# Patient Record
Sex: Male | Born: 1970 | Race: White | Hispanic: No | Marital: Married | Smoking: Never smoker
Health system: Southern US, Community
[De-identification: ages and names within clinical notes are randomized; demographics above are authoritative.]

## PROBLEM LIST (undated history)

## (undated) DIAGNOSIS — I10 Essential (primary) hypertension: Secondary | ICD-10-CM

## (undated) DIAGNOSIS — E7800 Pure hypercholesterolemia, unspecified: Secondary | ICD-10-CM

## (undated) HISTORY — PX: EYE MUSCLE SURGERY: SHX370

---

## 2005-09-13 ENCOUNTER — Ambulatory Visit: Payer: Self-pay | Admitting: Family Medicine

## 2006-03-31 ENCOUNTER — Ambulatory Visit: Payer: Self-pay | Admitting: Family Medicine

## 2006-09-20 ENCOUNTER — Ambulatory Visit: Payer: Self-pay | Admitting: Family Medicine

## 2006-11-29 ENCOUNTER — Ambulatory Visit (HOSPITAL_BASED_OUTPATIENT_CLINIC_OR_DEPARTMENT_OTHER): Admission: RE | Admit: 2006-11-29 | Discharge: 2006-11-29 | Payer: Self-pay | Admitting: Ophthalmology

## 2018-08-14 ENCOUNTER — Emergency Department: Payer: BLUE CROSS/BLUE SHIELD

## 2018-08-14 ENCOUNTER — Other Ambulatory Visit: Payer: Self-pay

## 2018-08-14 ENCOUNTER — Emergency Department
Admission: EM | Admit: 2018-08-14 | Discharge: 2018-08-14 | Disposition: A | Discharge status: Home / Self Care | Payer: BLUE CROSS/BLUE SHIELD | Attending: Emergency Medicine | Admitting: Emergency Medicine

## 2018-08-14 ENCOUNTER — Encounter: Payer: Self-pay | Admitting: Emergency Medicine

## 2018-08-14 DIAGNOSIS — Z79899 Other long term (current) drug therapy: Secondary | ICD-10-CM | POA: Diagnosis not present

## 2018-08-14 DIAGNOSIS — I10 Essential (primary) hypertension: Secondary | ICD-10-CM | POA: Insufficient documentation

## 2018-08-14 DIAGNOSIS — H53131 Sudden visual loss, right eye: Secondary | ICD-10-CM | POA: Diagnosis present

## 2018-08-14 DIAGNOSIS — H53121 Transient visual loss, right eye: Secondary | ICD-10-CM

## 2018-08-14 HISTORY — DX: Essential (primary) hypertension: I10

## 2018-08-14 HISTORY — DX: Pure hypercholesterolemia, unspecified: E78.00

## 2018-08-14 LAB — CBC
HEMATOCRIT: 44 % (ref 39.0–52.0)
Hemoglobin: 15.4 g/dL (ref 13.0–17.0)
MCH: 29.9 pg (ref 26.0–34.0)
MCHC: 35 g/dL (ref 30.0–36.0)
MCV: 85.4 fL (ref 80.0–100.0)
PLATELETS: 170 10*3/uL (ref 150–400)
RBC: 5.15 MIL/uL (ref 4.22–5.81)
RDW: 12.2 % (ref 11.5–15.5)
WBC: 4 10*3/uL (ref 4.0–10.5)
nRBC: 0 % (ref 0.0–0.2)

## 2018-08-14 LAB — BASIC METABOLIC PANEL
Anion gap: 7 (ref 5–15)
BUN: 12 mg/dL (ref 6–20)
CALCIUM: 9.3 mg/dL (ref 8.9–10.3)
CO2: 27 mmol/L (ref 22–32)
Chloride: 106 mmol/L (ref 98–111)
Creatinine, Ser: 1 mg/dL (ref 0.61–1.24)
GFR calc Af Amer: >60 mL/min (ref >60)
GFR calc non Af Amer: >60 mL/min (ref >60)
GLUCOSE: 97 mg/dL (ref 70–99)
POTASSIUM: 4.5 mmol/L (ref 3.5–5.1)
Sodium: 140 mmol/L (ref 135–145)

## 2018-08-14 NOTE — ED Provider Notes (Signed)
[facility] Emergency Department Provider Note  ____________________________________________  Time seen: Approximately 11:48 AM  I have reviewed the triage vital signs and the nursing notes.   HISTORY  Chief Complaint Loss of Vision   HPI Randy Ritter is a 47 y.o. male with a history of hypertension, hyperlipidemia, L eye strabismus presents for evaluation of visual loss.  Patient reports that he was driving to work around 1:61WR when he noticed a black circle on the periphery of his R eye which was closing and also wiggly lines on his visual field.  Symptoms were sudden and got progressively worse. Patient is not sure if he had similar symptoms on his left eye as he reports that "I never use that eye due to severe strabismus".  The symptoms lasted 10 to 15 minutes and have resolved at this time.  Patient reports that he went home and noticed that his blood pressure was elevated which made him concerned.  No slurred speech, facial droop, unilateral weakness or numbness, headache, nausea, vomiting.  No personal family history of stroke.  Patient has no history of smoking.  He went to urgent care and he was sent here for evaluation.  At this time he reports full resolution of his symptoms.   Past Medical History:  Diagnosis Date  . Hypercholesteremia   . Hypertension     There are no active problems to display for this patient.   Past Surgical History:  Procedure Laterality Date  . EYE MUSCLE SURGERY      Prior to Admission medications   Medication Sig Start Date End Date Taking? Authorizing Provider  fexofenadine (ALLEGRA) 180 MG tablet Take 180 mg by mouth daily.   Yes [provider]  lisinopril (PRINIVIL,ZESTRIL) 10 MG tablet Take 10 mg by mouth daily.   Yes [provider]  lovastatin (MEVACOR) 20 MG tablet Take 20 mg by mouth at bedtime.   Yes [provider]  pantoprazole (PROTONIX) 40 MG tablet Take 40 mg by mouth  daily.   Yes [provider]  ranitidine (ZANTAC) 150 MG tablet Take 150 mg by mouth at bedtime.   Yes [provider]    Allergies Patient has no known allergies.  FH No h/o stroke  Social History Social History   Tobacco Use  . Smoking status: Never Smoker  . Smokeless tobacco: Never Used  Substance Use Topics  . Alcohol use: Yes  . Drug use: Not on file    Review of Systems  Constitutional: Negative for fever. Eyes: + visual loss. ENT: Negative for sore throat. Neck: No neck pain  Cardiovascular: Negative for chest pain. Respiratory: Negative for shortness of breath. Gastrointestinal: Negative for abdominal pain, vomiting or diarrhea. Genitourinary: Negative for dysuria. Musculoskeletal: Negative for back pain. Skin: Negative for rash. Neurological: Negative for headaches, weakness or numbness. Psych: No SI or HI  ____________________________________________   PHYSICAL EXAM:  VITAL SIGNS: ED Triage Vitals  Enc Vitals Group     BP 08/14/18 1028 (!) 149/95     Pulse Rate 08/14/18 1028 73     Resp 08/14/18 1028 18     Temp 08/14/18 1028 98.2 F (36.8 C)     Temp Source 08/14/18 1028 Oral     SpO2 08/14/18 1028 98 %     Weight 08/14/18 1031 175 lb (79.4 kg)     Height 08/14/18 1031 5\' 6"  (1.676 m)     Head Circumference --      Peak Flow --  Pain Score 08/14/18 1031 0     Pain Loc --      Pain Edu? --      Excl. in GC? --     Constitutional: Alert and oriented. Well appearing and in no apparent distress. HEENT:      Head: Normocephalic and atraumatic.         Eyes: Conjunctivae are normal. Sclera is non-icteric. EOMI, PERRL, visual acuity 20/20 on the R and 20/30 on the L, fundoscopic exam unremarkable, intact visual fields bilaterally      Mouth/Throat: Mucous membranes are moist.       Neck: Supple with no signs of meningismus. Cardiovascular: Regular rate and rhythm. No murmurs, gallops, or rubs. 2+ symmetrical distal pulses  are present in all extremities. No JVD. Respiratory: Normal respiratory effort. Lungs are clear to auscultation bilaterally. No wheezes, crackles, or rhonchi.  Gastrointestinal: Soft, non tender, and non distended with positive bowel sounds. No rebound or guarding. Musculoskeletal: Nontender with normal range of motion in all extremities. No edema, cyanosis, or erythema of extremities. Neurologic: Normal speech and language.  Face symmetric, intact strength and sensation x4, no pronator drift or dysmetria. Skin: Skin is warm, dry and intact. No rash noted. Psychiatric: Mood and affect are normal. Speech and behavior are normal.  ____________________________________________   LABS (all labs ordered are listed, but only abnormal results are displayed)  Labs Reviewed  CBC  BASIC METABOLIC PANEL   ____________________________________________  EKG  ED ECG REPORT I, Nita Sicklearolina Countess Biebel, the attending physician, personally viewed and interpreted this ECG.  Normal sinus rhythm, rate of 85, normal intervals, normal axis, no ST elevations or depressions.  Normal EKG. ____________________________________________  RADIOLOGY  I have personally reviewed the images performed during this visit and I agree with the Radiologist's read.   Interpretation by Radiologist:  Ct Head Wo Contrast  Result Date: 08/14/2018 CLINICAL DATA:  Loss of right-sided peripheral vision, transient EXAM: CT HEAD WITHOUT CONTRAST TECHNIQUE: Contiguous axial images were obtained from the base of the skull through the vertex without intravenous contrast. COMPARISON:  None. FINDINGS: Brain: The ventricles are normal in size and configuration. There is no intracranial mass, hemorrhage, extra-axial fluid collection, or midline shift. Brain parenchyma appears unremarkable. No evident acute infarct. Vascular: No hyperdense vessel. There is calcification in each carotid siphon region. Skull: Bony calvarium appears intact.  Sinuses/Orbits: There is opacification in several ethmoid air cells. There is mucosal thickening along the superior anterior left maxillary antrum. There is mild mucosal thickening in the anterior sphenoid sinus regions. Other visualized paranasal sinuses are clear. Orbits appear symmetric bilaterally. Other: Mastoids are somewhat hypoplastic but clear. IMPRESSION: Brain parenchyma appears unremarkable. No acute infarct. No mass or hemorrhage. Foci of arterial vascular calcification noted. There are foci of paranasal sinus disease. Electronically Signed   By: Bretta BangWilliam  Woodruff III M.D.   On: 08/14/2018 11:51   Mr Brain Wo Contrast  Result Date: 08/14/2018 CLINICAL DATA:  47 year old male with abrupt transient nonpainful loss of right side peripheral vision this morning while driving. EXAM: MRI HEAD WITHOUT CONTRAST TECHNIQUE: Multiplanar, multiecho pulse sequences of the brain and surrounding structures were obtained without intravenous contrast. COMPARISON:  Head CT without contrast 1140 hours today. FINDINGS: Brain: Normal cerebral volume. No restricted diffusion to suggest acute infarction. No midline shift, mass effect, evidence of mass lesion, ventriculomegaly, extra-axial collection or acute intracranial hemorrhage. Cervicomedullary junction and pituitary are within normal limits. Wallace CullensGray and white matter signal is within normal limits for age throughout the  brain. No cortical encephalomalacia or chronic cerebral blood products identified. Deep gray matter nuclei, brainstem, and cerebellum are normal. Vascular: Major intracranial vascular flow voids are preserved. Skull and upper cervical spine: Normal visible cervical spine. Normal bone marrow signal. Sinuses/Orbits: Normal optic chiasm and suprasellar cistern. Normal noncontrast appearance of the cavernous sinus. Visible optic nerves and orbits soft tissues appears symmetric and within normal limits. Trace paranasal sinus mucosal thickening. Other: Visible  internal auditory structures appear normal. Trace right mastoid fluid. Scalp and face soft tissues appear negative. IMPRESSION: Normal for age noncontrast MRI appearance of the brain. No acute intracranial abnormality or explanation for visual changes. Electronically Signed   By: Odessa Fleming M.D.   On: 08/14/2018 13:23     ____________________________________________   PROCEDURES  Procedure(s) performed: None Procedures Critical Care performed:  None ____________________________________________   INITIAL IMPRESSION / ASSESSMENT AND PLAN / ED COURSE  48 y.o. male with a history of hypertension, hyperlipidemia, L eye strabismus presents for evaluation of brief, painless episode of R eye visual loss.  Unfortunately patient is not certain if his symptoms were localized to the right eye only due to history of severe strabismus on the left eye.  If symptoms are unilateral, my concern would be an etiology coming from the eye or cranial nerve II. Eye exam here is unremarkable with normal funduscopic, intact extraocular movements, normal visual acuity, intact visual fields.  However since patient is unable to confirm that the symptoms were unilateral I am going to evaluate him for a possible stroke/TIA.  Head CT is negative.  We will pursue an MRI and if that is negative we will consult ophthalmology for formal evaluation.    _________________________ 1:34 PM on 08/14/2018 -----------------------------------------  Imaging including head CT and MRI negative for stroke.  Patient remains with no further episodes of visual loss.  Discussed with Dr. Druscilla Brownie from [facility] who recommended the patient be sent to his office after discharge for further evaluation.  Patient is a patient at [facility] and will proceed there now for a formal eye exam.  Discussed return precautions for any signs of stroke.   As part of my medical decision making, I reviewed the following data within the electronic medical  record:  Nursing notes reviewed and incorporated, Labs reviewed , EKG interpreted , Old EKG reviewed, Old chart reviewed, Radiograph reviewed , A consult was requested and obtained from this/these consultant(s) ophthalmology, Notes from prior ED visits and  Controlled Substance Database    Pertinent labs & imaging results that were available during my care of the patient were reviewed by me and considered in my medical decision making (see chart for details).    ____________________________________________   FINAL CLINICAL IMPRESSION(S) / ED DIAGNOSES  Final diagnoses:  Visual loss, transient, right      NEW MEDICATIONS STARTED DURING THIS VISIT:  ED Discharge Orders    None       Note:  This document was prepared using Dragon voice recognition software and may include unintentional dictation errors.    Nita Sickle, MD 08/14/18 [phone]

## 2018-08-14 NOTE — ED Notes (Addendum)
Pt reports sudden loss of peripheral vision on the right side while he was driving this morning. Pt states the episode lasted about 15 minutes.  Pt denies any pain at the time and currently denies pain. Pt wearing glasses. Pt denies headache.  Pt states he had surgery on eyes about 9 years ago. Denies any hx of diabetes. Pt has hx of HTN and high cholesterol. Informed pt the oncoming MD would be the one to see him.

## 2018-08-14 NOTE — ED Notes (Signed)
Patient transported to CT 

## 2018-08-14 NOTE — ED Triage Notes (Signed)
brougt from [facility].  Had episode of peripheral vision loss in right eye this am lasting about 15 minutes.  Back to normal now.  No other symptoms at the time. Says he was driving and went back home.

## 2018-08-14 NOTE — ED Notes (Signed)
Patient transported to MRI 

## 2019-08-28 IMAGING — MR MR HEAD W/O CM
11 series · 40 of 48 positions shown · non-contrast
Comparison: Head CT without contrast 0083 hours today.

CLINICAL DATA: 47-year-old male with abrupt transient nonpainful
loss of right side peripheral vision this morning while driving.

EXAM:
MRI HEAD WITHOUT CONTRAST
TECHNIQUE: Multiplanar, multiecho pulse sequences of the brain and surrounding
structures were obtained without intravenous contrast.

[Series 5: ax dwi_tracew · axial · 3.0mm · 0.60mm/px · z∈[-20,+140]mm · 5 of 55 slices shown]
[im 1/55]
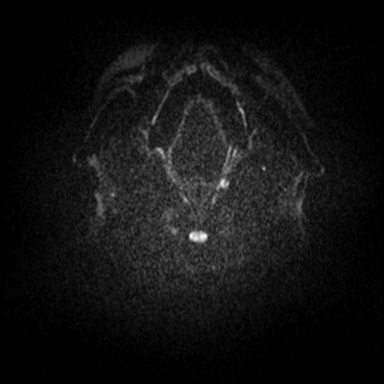
[im 14/55]
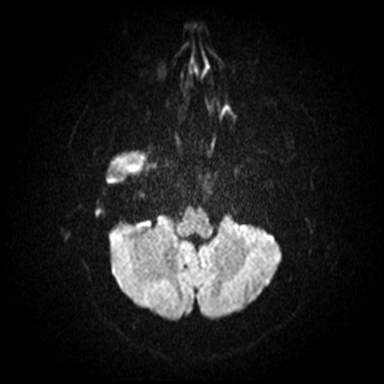
[im 28/55]
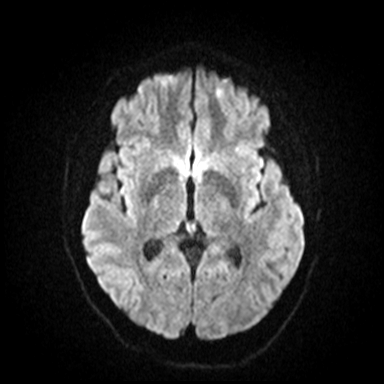
[im 41/55]
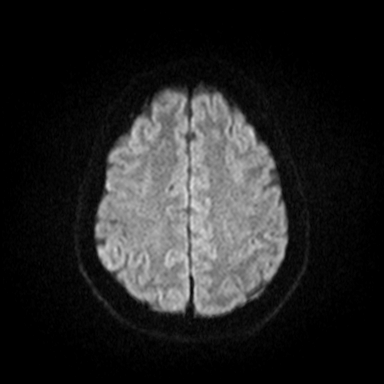
[im 55/55]
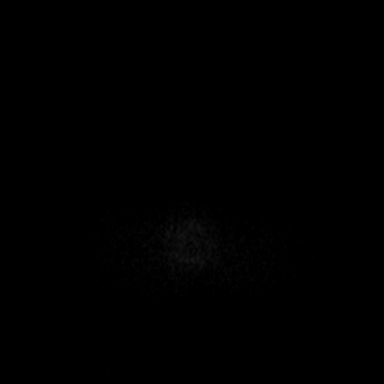

[Series 6: ax dwi_adc · axial · 3.0mm · 0.60mm/px · z∈[-20,+140]mm · 5 of 55 slices shown]
[im 1/55]
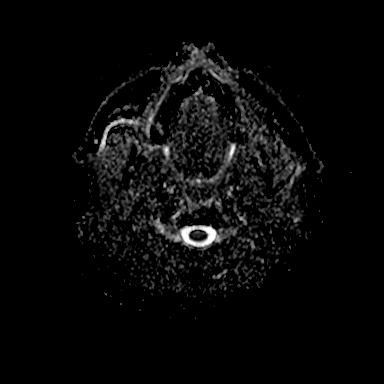
[im 14/55]
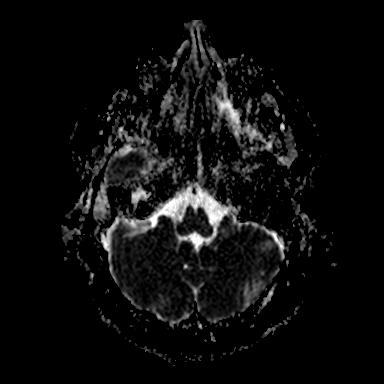
[im 28/55]
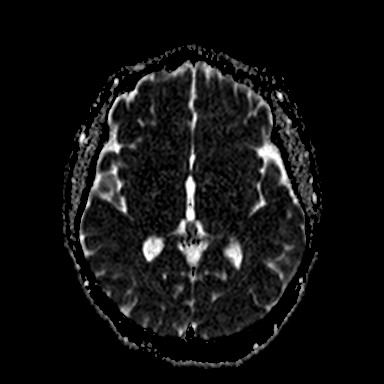
[im 41/55]
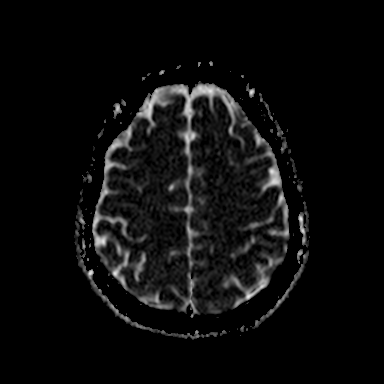
[im 55/55]
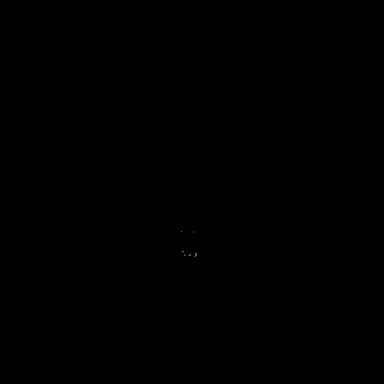

[Series 7: cor dwi_tracew · coronal · 5.0mm · 0.60mm/px · 3 of 39 slices shown]
[im 1/39]
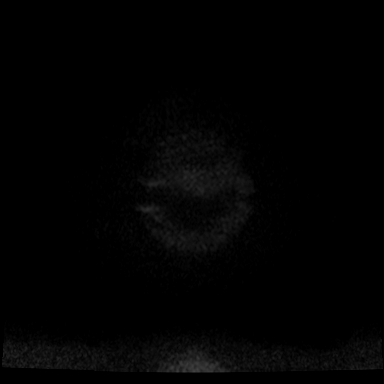
[im 20/39]
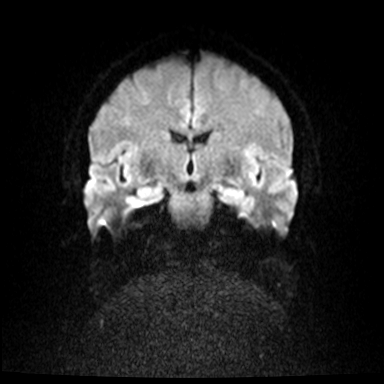
[im 39/39]
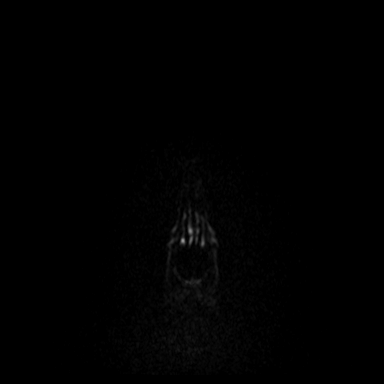

[Series 8: cor dwi_adc · coronal · 5.0mm · 0.60mm/px · 3 of 39 slices shown]
[im 1/39]
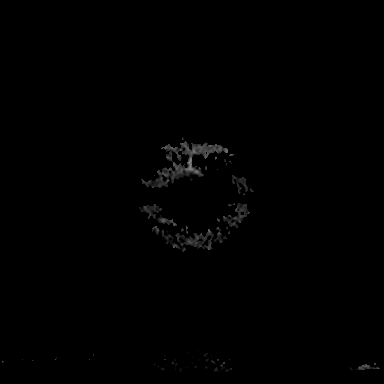
[im 20/39]
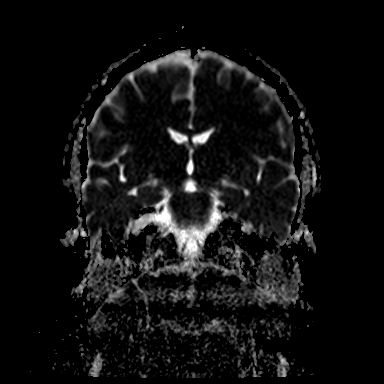
[im 39/39]
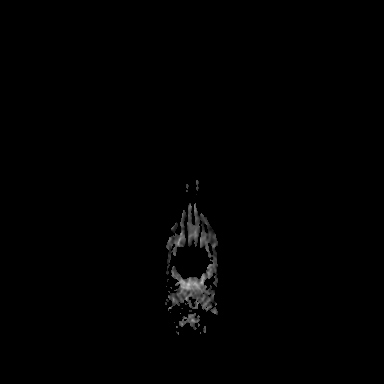

[Series 9: T1 · sagittal · 5.0mm · 0.61mm/px · 2 of 22 slices shown (1 of 2)]
[im 1/22]
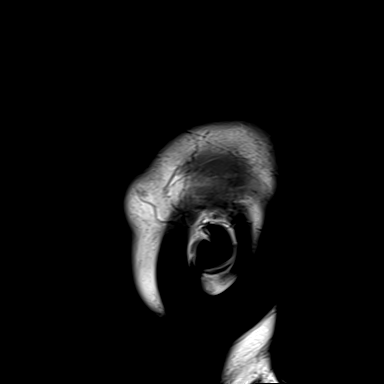
[im 22/22]
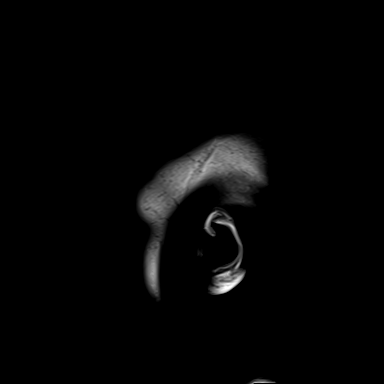

[Series 10: T2 · axial · 5.0mm · 0.53mm/px · z∈[-18,+136]mm · 2 of 27 slices shown (1 of 2)]
[im 1/27]
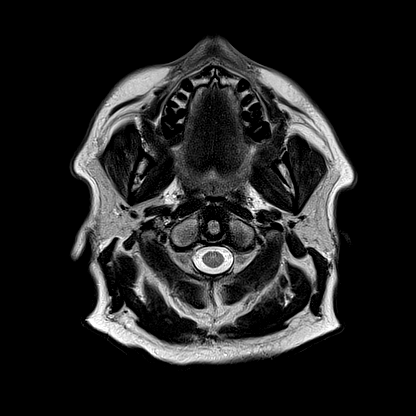
[im 27/27]
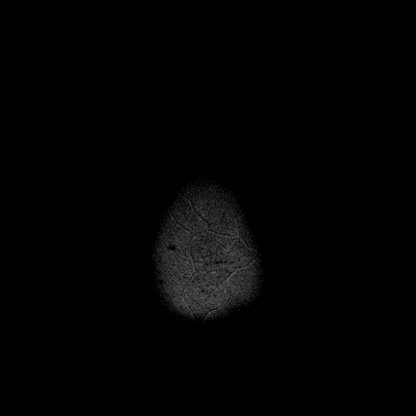

[Series 11: swi_images · axial · 3.0mm · 0.90mm/px · z∈[-27,+147]mm · 5 of 60 slices shown]
[im 1/60]
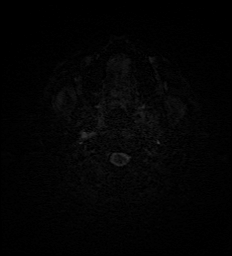
[im 15/60]
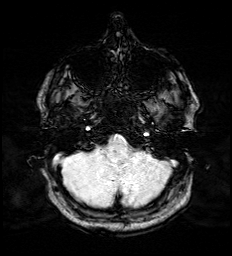
[im 30/60]
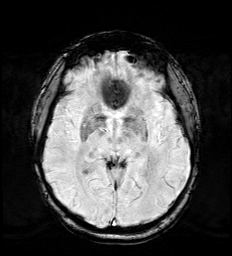
[im 45/60]
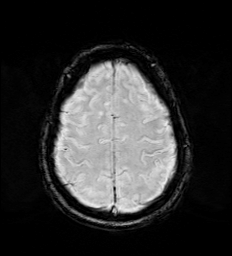
[im 60/60]
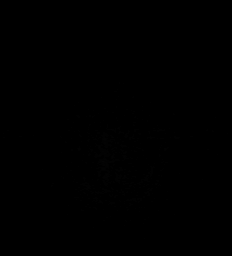

[Series 12: mip_images(sw) · axial · 24.0mm · 0.90mm/px · 1 of 53 slices shown]
[im 1/53]
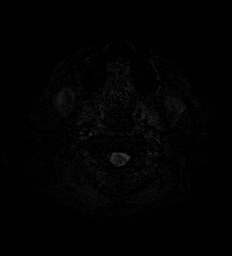

[Series 13: FLAIR · axial · 3.0mm · 0.53mm/px · z∈[-21,+139]mm · 4 of 55 slices shown]
[im 1/55]
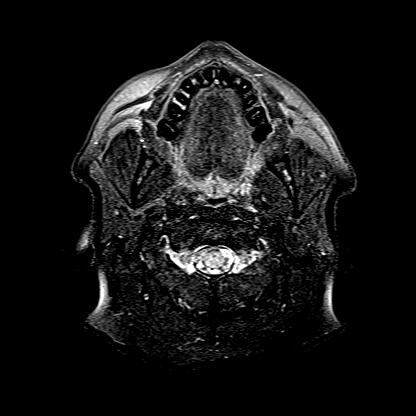
[im 19/55]
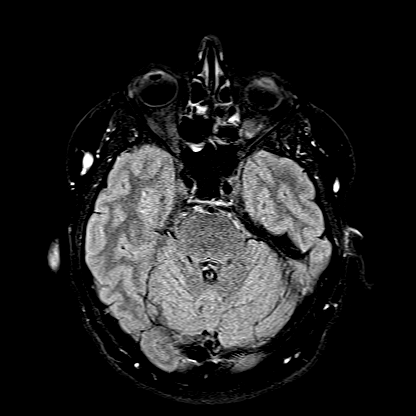
[im 37/55]
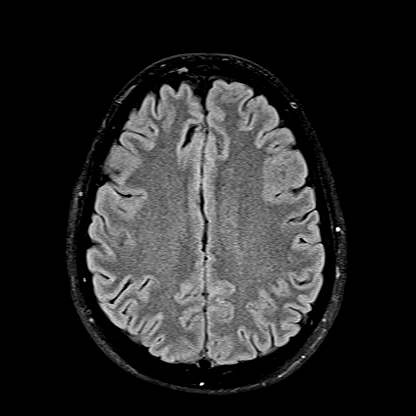
[im 55/55]
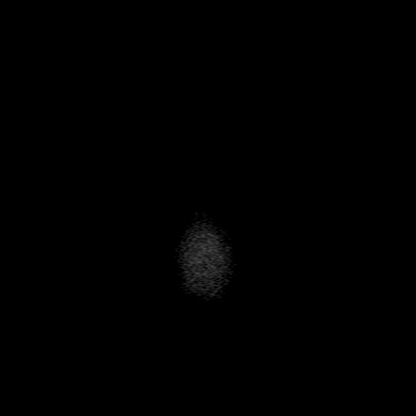

[Series 14: T1 · axial · 1.0mm · 0.98mm/px · z∈[-17,+140]mm · 8 of 160 slices shown (2 of 2)]
[im 1/160]
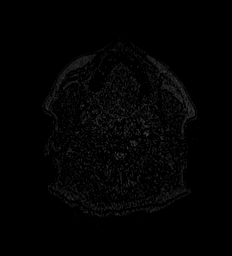
[im 27/160]
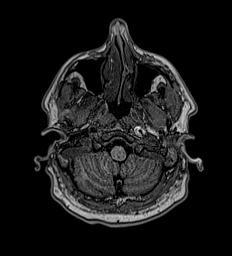
[im 54/160]
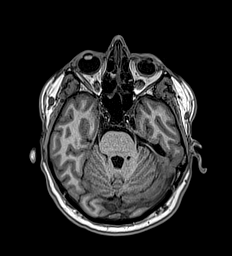
[im 67/160]
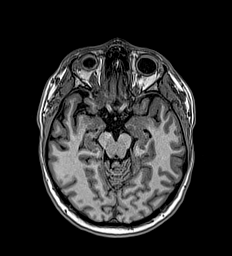
[im 93/160]
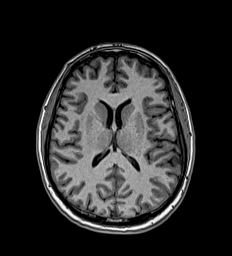
[im 107/160]
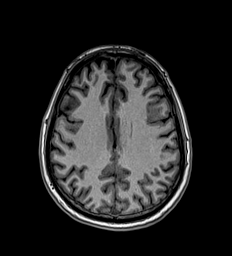
[im 133/160]
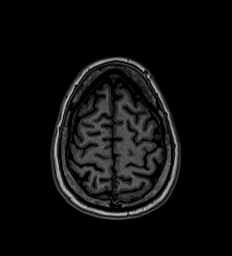
[im 160/160]
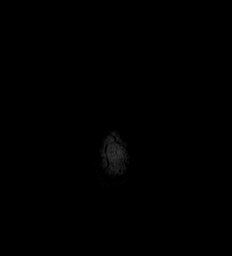

[Series 15: T2 · coronal · 5.0mm · 0.57mm/px · 2 of 29 slices shown (2 of 2)]
[im 1/29]
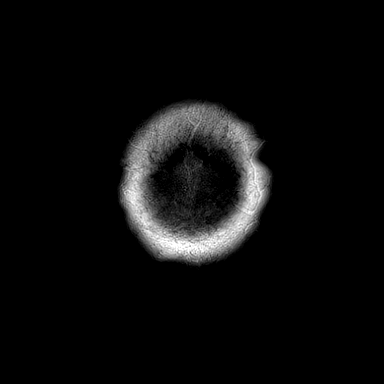
[im 29/29]
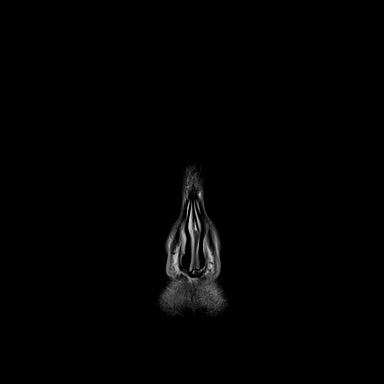

[40 of 48 positions shown; findings below may reference images not displayed]

FINDINGS: Brain: Normal cerebral volume. No restricted diffusion to suggest
acute infarction. No midline shift, mass effect, evidence of mass
lesion, ventriculomegaly, extra-axial collection or acute
intracranial hemorrhage. Cervicomedullary junction and pituitary are
within normal limits.

Gray and white matter signal is within normal limits for age
throughout the brain. No cortical encephalomalacia or chronic
cerebral blood products identified. Deep gray matter nuclei,
brainstem, and cerebellum are normal.

Vascular: Major intracranial vascular flow voids are preserved.

Skull and upper cervical spine: Normal visible cervical spine.
Normal bone marrow signal.

Sinuses/Orbits: Normal optic chiasm and suprasellar cistern. Normal
noncontrast appearance of the cavernous sinus. Visible optic nerves
and orbits soft tissues appears symmetric and within normal limits.

Trace paranasal sinus mucosal thickening.

Other: Visible internal auditory structures appear normal. Trace
right mastoid fluid. Scalp and face soft tissues appear negative.
IMPRESSION: Normal for age noncontrast MRI appearance of the brain.

No acute intracranial abnormality or explanation for visual changes.

## 2020-07-02 LAB — COLOGUARD: COLOGUARD: NEGATIVE

## 2024-06-21 ENCOUNTER — Ambulatory Visit: Payer: Self-pay

## 2024-06-21 DIAGNOSIS — K5730 Diverticulosis of large intestine without perforation or abscess without bleeding: Secondary | ICD-10-CM | POA: Diagnosis not present

## 2024-06-21 DIAGNOSIS — Z1211 Encounter for screening for malignant neoplasm of colon: Secondary | ICD-10-CM | POA: Diagnosis present

## 2024-06-21 DIAGNOSIS — K640 First degree hemorrhoids: Secondary | ICD-10-CM | POA: Diagnosis not present
